# Patient Record
Sex: Female | Born: 2000 | Race: White | Hispanic: Yes | Marital: Single | Smoking: Never smoker
Health system: Southern US, Community
[De-identification: ages and names within clinical notes are randomized; demographics above are authoritative.]

## PROBLEM LIST (undated history)

## (undated) ENCOUNTER — Emergency Department (HOSPITAL_COMMUNITY): Discharge status: Absent without leave | Payer: Self-pay

---
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
## 2018-01-07 ENCOUNTER — Encounter: Payer: Self-pay | Admitting: Internal Medicine

## 2018-01-08 MED ORDER — NORETHIN ACE-ETH ESTRAD-FE 1.5-30 MG-MCG PO TABS: 1.0000 | ORAL_TABLET | Freq: Every day | ORAL | 0 refills | Status: DC

## 2018-03-29 ENCOUNTER — Encounter: Payer: Self-pay | Admitting: Internal Medicine

## 2018-03-29 MED ORDER — NORETHIN ACE-ETH ESTRAD-FE 1.5-30 MG-MCG PO TABS: 1.0000 | ORAL_TABLET | Freq: Every day | ORAL | 1 refills | Status: DC

## 2018-05-12 ENCOUNTER — Encounter: Payer: Self-pay | Admitting: Internal Medicine

## 2018-05-13 MED ORDER — NORETHIN ACE-ETH ESTRAD-FE 1.5-30 MG-MCG PO TABS: 1.0000 | ORAL_TABLET | Freq: Every day | ORAL | 0 refills | Status: DC

## 2018-06-23 ENCOUNTER — Encounter: Payer: Self-pay | Admitting: Internal Medicine

## 2018-06-23 MED ORDER — NORETHIN ACE-ETH ESTRAD-FE 1.5-30 MG-MCG PO TABS: 1.0000 | ORAL_TABLET | Freq: Every day | ORAL | 0 refills | Status: DC

## 2018-09-18 ENCOUNTER — Encounter: Payer: Self-pay | Admitting: Internal Medicine

## 2018-09-20 MED ORDER — NORETHIN ACE-ETH ESTRAD-FE 1.5-30 MG-MCG PO TABS: 1.0000 | ORAL_TABLET | Freq: Every day | ORAL | 0 refills | Status: DC

## 2018-10-18 ENCOUNTER — Ambulatory Visit (INDEPENDENT_AMBULATORY_CARE_PROVIDER_SITE_OTHER): Payer: BLUE CROSS/BLUE SHIELD | Admitting: Internal Medicine

## 2018-10-18 ENCOUNTER — Encounter: Payer: Self-pay | Admitting: Internal Medicine

## 2018-10-18 VITALS — BP 116/78 | HR 76 | Temp 98.2°F | Ht 65.5 in | Wt 179.0 lb

## 2018-10-18 DIAGNOSIS — Z0000 Encounter for general adult medical examination without abnormal findings: Secondary | ICD-10-CM

## 2018-10-18 LAB — LIPID PANEL
CHOL/HDL RATIO: 2
Cholesterol: 142 mg/dL (ref 0–200)
HDL: 69.3 mg/dL (ref >39.00)
LDL CALC: 49 mg/dL (ref 0–99)
NonHDL: 72.36
TRIGLYCERIDES: 119 mg/dL (ref 0.0–149.0)
VLDL: 23.8 mg/dL (ref 0.0–40.0)

## 2018-10-18 LAB — COMPREHENSIVE METABOLIC PANEL
ALT: 22 U/L (ref 0–35)
AST: 23 U/L (ref 0–37)
Albumin: 4.4 g/dL (ref 3.5–5.2)
Alkaline Phosphatase: 79 U/L (ref 47–119)
BUN: 11 mg/dL (ref 6–23)
CALCIUM: 9.6 mg/dL (ref 8.4–10.5)
CHLORIDE: 101 meq/L (ref 96–112)
CO2: 28 meq/L (ref 19–32)
Creatinine, Ser: 0.69 mg/dL (ref 0.40–1.20)
GFR: 116.17 mL/min (ref >60.00)
Glucose, Bld: 74 mg/dL (ref 70–99)
POTASSIUM: 3.8 meq/L (ref 3.5–5.1)
Sodium: 136 mEq/L (ref 135–145)
Total Bilirubin: 0.4 mg/dL (ref 0.2–1.2)
Total Protein: 7.6 g/dL (ref 6.0–8.3)

## 2018-10-18 LAB — CBC
HEMATOCRIT: 41 % (ref 36.0–49.0)
HEMOGLOBIN: 13.8 g/dL (ref 12.0–16.0)
MCHC: 33.7 g/dL (ref 31.0–37.0)
MCV: 87.3 fl (ref 78.0–98.0)
PLATELETS: 282 10*3/uL (ref 150.0–575.0)
RBC: 4.7 Mil/uL (ref 3.80–5.70)
RDW: 13.2 % (ref 11.4–15.5)
WBC: 8.5 10*3/uL (ref 4.5–13.5)

## 2018-10-18 NOTE — Progress Notes (Signed)
Subjective:    Patient ID: Madeline Vazquez, female    DOB: 2000/11/19, 17 y.o.   MRN: [id number]  HPI  Pt presents to the clinic today for her annual exam.  Flu: 05/2016 Tetanus: 03/2010 Dentist: [email or web address]  Diet: She does eat meat. She consumes some fruits and veggies. She does eat fried foods. She drinks mostly water. Exercise: 30 minutes, 3 x week, home videos   Review of Systems      No past medical history on file.  Current Outpatient Medications  Medication Sig Dispense Refill  . norethindrone-ethinyl estradiol-iron (BLISOVI FE 1.5/30) 1.5-30 MG-MCG tablet Take 1 tablet by mouth daily. 3 Package 0   No current facility-administered medications for this visit.     No Known Allergies  Family History  Problem Relation Age of Onset  . Alcohol abuse Father   . Arthritis Maternal Grandmother     Social History   Socioeconomic History  . Marital status: Single    Spouse name: Not on file  . Number of children: Not on file  . Years of education: Not on file  . Highest education level: Not on file  Occupational History  . Not on file  Social Needs  . Financial resource strain: Not on file  . Food insecurity:    Worry: Not on file    Inability: Not on file  . Transportation needs:    Medical: Not on file    Non-medical: Not on file  Tobacco Use  . Smoking status: Never Smoker  . Smokeless tobacco: Never Used  Substance and Sexual Activity  . Alcohol use: No    Alcohol/week: 0.0 standard drinks  . Drug use: No  . Sexual activity: Never  Lifestyle  . Physical activity:    Days per week: Not on file    Minutes per session: Not on file  . Stress: Not on file  Relationships  . Social connections:    Talks on phone: Not on file    Gets together: Not on file    Attends religious service: Not on file    Active member of club or organization: Not on file    Attends meetings of clubs or organizations: Not on file    Relationship status: Not on file  .  Intimate partner violence:    Fear of current or ex partner: Not on file    Emotionally abused: Not on file    Physically abused: Not on file    Forced sexual activity: Not on file  Other Topics Concern  . Not on file  Social History Narrative  . Not on file     Constitutional: Denies fever, malaise, fatigue, headache or abrupt weight changes.  HEENT: Denies eye pain, eye redness, ear pain, ringing in the ears, wax buildup, runny nose, nasal congestion, bloody nose, or sore throat. Respiratory: Denies difficulty breathing, shortness of breath, cough or sputum production.   Cardiovascular: Denies chest pain, chest tightness, palpitations or swelling in the hands or feet.  Gastrointestinal: Denies abdominal pain, bloating, constipation, diarrhea or blood in the stool.  GU: Denies urgency, frequency, pain with urination, burning sensation, blood in urine, odor or discharge. Musculoskeletal: Denies decrease in range of motion, difficulty with gait, muscle pain or joint pain and swelling.  Skin: Denies redness, rashes, lesions or ulcercations.  Neurological: Denies dizziness, difficulty with memory, difficulty with speech or problems with balance and coordination.  Psych: Denies anxiety, depression, SI/HI.  No other specific complaints in a complete review  of systems (except as listed in HPI above).  Objective:   Physical Exam   BP 116/78   Pulse 76   Temp 98.2 F (36.8 C) (Oral)   Ht 5' 5.5" (1.664 m)   Wt 179 lb (81.2 kg)   LMP 10/10/2018   SpO2 98%   BMI 29.33 kg/m  Wt Readings from Last 3 Encounters:  10/18/18 179 lb (81.2 kg) (95 %, Z= 1.60)*  10/13/17 160 lb (72.6 kg) (90 %, Z= 1.25)*  04/14/17 178 lb (80.7 kg) (95 %, Z= 1.66)*   * Growth percentiles are based on CDC (Girls, 2-20 Years) data.    General: Appears her stated age, well developed, well nourished in NAD. Skin: Warm, dry and intact.  HEENT: Head: normal shape and size; Eyes: sclera white, no icterus,  conjunctiva pink, PERRLA and EOMs intact; Ears: Tm's gray and intact, normal light reflex; Throat/Mouth: Teeth present, mucosa pink and moist, no exudate, lesions or ulcerations noted.  Neck:  Neck supple, trachea midline. No masses, lumps or thyromegaly present.  Cardiovascular: Normal rate and rhythm. S1,S2 noted.  No murmur, rubs or gallops noted. No JVD or BLE edema. Pulmonary/Chest: Normal effort and positive vesicular breath sounds. No respiratory distress. No wheezes, rales or ronchi noted.  Abdomen: Soft and nontender. Normal bowel sounds. No distention or masses noted. Liver, spleen and kidneys non palpable. Musculoskeletal: Strength 5/5 BUE/BLE. No difficulty with gait.  Neurological: Alert and oriented. Cranial nerves II-XII grossly intact. Coordination normal.  Psychiatric: Mood and affect normal. Behavior is normal. Judgment and thought content normal.     BMET No results found for: NA, K, CL, CO2, GLUCOSE, BUN, CREATININE, CALCIUM, GFRNONAA, GFRAA  Lipid Panel     Component Value Date/Time   CHOL 168 06/26/2016 1525   TRIG 79.0 06/26/2016 1525   HDL 67.00 06/26/2016 1525   CHOLHDL 3 06/26/2016 1525   VLDL 15.8 06/26/2016 1525   LDLCALC 86 06/26/2016 1525    CBC    Component Value Date/Time   WBC 8.1 06/26/2016 1525   RBC 4.66 06/26/2016 1525   HGB 13.5 06/26/2016 1525   HCT 40.8 06/26/2016 1525   PLT 285.0 06/26/2016 1525   MCV 87.7 06/26/2016 1525   MCHC 33.1 06/26/2016 1525   RDW 13.8 06/26/2016 1525    Hgb A1C No results found for: HGBA1C         Assessment & Plan:   Preventative Health Maintenance:  She declines flu shot Tetanus UTD Encouraged her to consume a balanced diet and exercise regimen Advised her to see a dentist annually Will check CBC, CMET, Lipid  Profile. She declines STD screening today  Return precautions discussed Nicki Reaperegina , NP'

## 2018-10-18 NOTE — Patient Instructions (Signed)

## 2018-12-10 ENCOUNTER — Encounter: Payer: Self-pay | Admitting: Internal Medicine

## 2018-12-10 MED ORDER — NORETHIN ACE-ETH ESTRAD-FE 1.5-30 MG-MCG PO TABS: 1.0000 | ORAL_TABLET | Freq: Every day | ORAL | 0 refills | Status: DC

## 2019-05-11 ENCOUNTER — Other Ambulatory Visit: Payer: Self-pay | Admitting: Internal Medicine

## 2019-07-21 ENCOUNTER — Encounter: Payer: Self-pay | Admitting: Internal Medicine

## 2020-06-05 ENCOUNTER — Telehealth (INDEPENDENT_AMBULATORY_CARE_PROVIDER_SITE_OTHER): Payer: Self-pay | Admitting: Internal Medicine

## 2020-06-05 ENCOUNTER — Other Ambulatory Visit: Payer: Self-pay

## 2020-06-05 ENCOUNTER — Encounter: Payer: Self-pay | Admitting: Internal Medicine

## 2020-06-05 DIAGNOSIS — J069 Acute upper respiratory infection, unspecified: Secondary | ICD-10-CM

## 2020-06-05 MED ORDER — AZITHROMYCIN 250 MG PO TABS: ORAL_TABLET | ORAL | 0 refills | Status: DC

## 2020-06-05 NOTE — Patient Instructions (Signed)

## 2020-06-05 NOTE — Progress Notes (Signed)
Virtual Visit via Video Note  I connected with Madeline Vazquez on 06/05/20 at 11:30 AM EDT by a video enabled telemedicine application and verified that I am speaking with the correct person using two identifiers.  Location: Patient: Home Provider: Office   I discussed the limitations of evaluation and management by telemedicine and the availability of in person appointments. The patient expressed understanding and agreed to proceed.  HPI  Patient reports nasal congestion, ear fullness, cough and chest congestion. She reports this started 2 weeks ago. She is blowing yellow/green mucus out of her nose. She denies ear pain, drainage or loss of hearing. The cough feels wet but is mostly nonproductive. She denies headache, facial pain or pressure, eye pain, eye redness or discharge, sore throat, loss of taste/smell or shortness of breath. She denies fever, chills or body aches. She has taken some medication OTC with minimal relief of symptoms. She has not had sick contacts or exposure to Covid that she is aware of. She has not had her Covid vaccine.  Review of Systems    No past medical history on file.  Family History  Problem Relation Age of Onset  . Alcohol abuse Father   . Arthritis Maternal Grandmother     Social History   Socioeconomic History  . Marital status: Single    Spouse name: Not on file  . Number of children: Not on file  . Years of education: Not on file  . Highest education level: Not on file  Occupational History  . Not on file  Tobacco Use  . Smoking status: Never Smoker  . Smokeless tobacco: Never Used  Substance and Sexual Activity  . Alcohol use: No    Alcohol/week: 0.0 standard drinks  . Drug use: No  . Sexual activity: Never  Other Topics Concern  . Not on file  Social History Narrative  . Not on file   Social Determinants of Health   Financial Resource Strain:   . Difficulty of Paying Living Expenses:   Food Insecurity:   . Worried About  Programme researcher, broadcasting/film/video in the Last Year:   . Barista in the Last Year:   Transportation Needs:   . Freight forwarder (Medical):   Marland Kitchen Lack of Transportation (Non-Medical):   Physical Activity:   . Days of Exercise per Week:   . Minutes of Exercise per Session:   Stress:   . Feeling of Stress :   Social Connections:   . Frequency of Communication with Friends and Family:   . Frequency of Social Gatherings with Friends and Family:   . Attends Religious Services:   . Active Member of Clubs or Organizations:   . Attends Banker Meetings:   Marland Kitchen Marital Status:   Intimate Partner Violence:   . Fear of Current or Ex-Partner:   . Emotionally Abused:   Marland Kitchen Physically Abused:   . Sexually Abused:     No Known Allergies   Constitutional: Denies headache, fatigue, fever or abrupt weight changes.  HEENT:  Positive ear fullness, nasal congestion. Denies eye redness, ear pain, ringing in the ears, wax buildup, runny nose or sore throat. Respiratory: Positive cough. Denies difficulty breathing or shortness of breath.  Cardiovascular: Denies chest pain, chest tightness, palpitations or swelling in the hands or feet.   No other specific complaints in a complete review of systems (except as listed in HPI above).  Objective:    General: Appears her stated age, in NAD.  HEENT: Head: normal shape and size; Nose: Sounds congested; Throat/Mouth: No hoarseness noted.   Pulmonary/Chest: Normal effort. No respiratory distress.       Assessment & Plan:   Upper Respiratory Infection:  Start Zyrtec and Flonase OTC eRx for Zithromax x5 days Delsym OTC as needed for cough  RTC as needed or if symptoms persist. Nicki Reaper, NP    Follow Up Instructions:    I discussed the assessment and treatment plan with the patient. The patient was provided an opportunity to ask questions and all were answered. The patient agreed with the plan and demonstrated an understanding of the  instructions.   The patient was advised to call back or seek an in-person evaluation if the symptoms worsen or if the condition fails to improve as anticipated.    Nicki Reaper, NP

## 2021-10-02 ENCOUNTER — Telehealth: Payer: Self-pay | Admitting: Emergency Medicine

## 2021-10-02 DIAGNOSIS — J069 Acute upper respiratory infection, unspecified: Secondary | ICD-10-CM

## 2021-10-02 DIAGNOSIS — J029 Acute pharyngitis, unspecified: Secondary | ICD-10-CM

## 2021-10-02 NOTE — Progress Notes (Signed)
  E-Visit for Sore Throat  We are sorry that you are not feeling well.  Here is how we plan to help!  Your symptoms indicate a likely viral infection (Pharyngitis).   Pharyngitis is inflammation in the back of the throat which can cause a sore throat, scratchiness and sometimes difficulty swallowing.   Pharyngitis is typically caused by a respiratory virus and will just run its course.  Please keep in mind that your symptoms could last up to 10 days.  For throat pain, we recommend over the counter oral pain relief medications such as acetaminophen or aspirin, or anti-inflammatory medications such as ibuprofen or naproxen sodium.  Topical treatments such as oral throat lozenges or sprays may be used as needed.  Avoid close contact with loved ones, especially the very young and elderly.  Remember to wash your hands thoroughly throughout the day as this is the number one way to prevent the spread of infection and wipe down door knobs and counters with disinfectant.  After careful review of your answers, I would not recommend an antibiotic for your condition.  Antibiotics should not be used to treat conditions that we suspect are caused by viruses like the virus that causes the common cold or flu. However, some people can have Strep with atypical symptoms. You may need formal testing in clinic or office to confirm if your symptoms continue or worsen.  Providers prescribe antibiotics to treat infections caused by bacteria. Antibiotics are very powerful in treating bacterial infections when they are used properly.  To maintain their effectiveness, they should be used only when necessary.  Overuse of antibiotics has resulted in the development of super bugs that are resistant to treatment!    Home Care: Only take medications as instructed by your medical team. Do not drink alcohol while taking these medications. A steam or ultrasonic humidifier can help congestion.  You can place a towel over your head and  breathe in the steam from hot water coming from a faucet. Avoid close contacts especially the very young and the elderly. Cover your mouth when you cough or sneeze. Always remember to wash your hands.  Get Help Right Away If: You develop worsening fever or throat pain. You develop a severe head ache or visual changes. Your symptoms persist after you have completed your treatment plan.  Make sure you Understand these instructions. Will watch your condition. Will get help right away if you are not doing well or get worse.   Thank you for choosing an e-visit.  Your e-visit answers were reviewed by a board certified advanced clinical practitioner to complete your personal care plan. Depending upon the condition, your plan could have included both over the counter or prescription medications.  Please review your pharmacy choice. Make sure the pharmacy is open so you can pick up prescription now. If there is a problem, you may contact your provider through MyChart messaging and have the prescription routed to another pharmacy.  Your safety is important to us. If you have drug allergies check your prescription carefully.   For the next 24 hours you can use MyChart to ask questions about today's visit, request a non-urgent call back, or ask for a work or school excuse. You will get an email in the next two days asking about your experience. I hope that your e-visit has been valuable and will speed your recovery.  Approximately 5 minutes was used in reviewing the patient's chart, questionnaire, prescribing medications, and documentation.  

## 2022-09-09 ENCOUNTER — Telehealth: Payer: Self-pay | Admitting: Nurse Practitioner

## 2022-09-09 DIAGNOSIS — J069 Acute upper respiratory infection, unspecified: Secondary | ICD-10-CM

## 2022-09-09 MED ORDER — FLUTICASONE PROPIONATE 50 MCG/ACT NA SUSP: 2.0000 | Freq: Every day | NASAL | 6 refills | Status: AC

## 2022-09-09 NOTE — Progress Notes (Signed)
E-Visit for Sinus Problems  We are sorry that you are not feeling well.  Here is how we plan to help!  Based on what you have shared with me it looks like you have sinusitis.  Sinusitis is inflammation and infection in the sinus cavities of the head.  Based on your presentation I believe you most likely have Acute Viral Sinusitis.This is an infection most likely caused by a virus. There is not specific treatment for viral sinusitis other than to help you with the symptoms until the infection runs its course.  You may use an oral decongestant such as Mucinex D or if you have glaucoma or high blood pressure use plain Mucinex. Saline nasal spray help and can safely be used as often as needed for congestion, I have prescribed: Fluticasone nasal spray two sprays in each nostril once a day.  Providers prescribe antibiotics to treat infections caused by bacteria. Antibiotics are very powerful in treating bacterial infections when they are used properly. To maintain their effectiveness, they should be used only when necessary. Overuse of antibiotics has resulted in the development of superbugs that are resistant to treatment!    After careful review of your answers, I would not recommend an antibiotic for your condition.  Antibiotics are not effective against viruses and therefore should not be used to treat them. Common examples of infections caused by viruses include colds and flu   Some authorities believe that zinc sprays or the use of Echinacea may shorten the course of your symptoms.  Sinus infections are not as easily transmitted as other respiratory infection, however we still recommend that you avoid close contact with loved ones, especially the very young and elderly.  Remember to wash your hands thoroughly throughout the day as this is the number one way to prevent the spread of infection!  Home Care: Only take medications as instructed by your medical team. Do not take these medications with  alcohol. A steam or ultrasonic humidifier can help congestion.  You can place a towel over your head and breathe in the steam from hot water coming from a faucet. Avoid close contacts especially the very young and the elderly. Cover your mouth when you cough or sneeze. Always remember to wash your hands.  Get Help Right Away If: You develop worsening fever or sinus pain. You develop a severe head ache or visual changes. Your symptoms persist after you have completed your treatment plan.  Make sure you Understand these instructions. Will watch your condition. Will get help right away if you are not doing well or get worse.   Thank you for choosing an e-visit.  Your e-visit answers were reviewed by a board certified advanced clinical practitioner to complete your personal care plan. Depending upon the condition, your plan could have included both over the counter or prescription medications.  Please review your pharmacy choice. Make sure the pharmacy is open so you can pick up prescription now. If there is a problem, you may contact your provider through MyChart messaging and have the prescription routed to another pharmacy.  Your safety is important to us. If you have drug allergies check your prescription carefully.   For the next 24 hours you can use MyChart to ask questions about today's visit, request a non-urgent call back, or ask for a work or school excuse. You will get an email in the next two days asking about your experience. I hope that your e-visit has been valuable and will speed your recovery.    Meds ordered this encounter  Medications   fluticasone (FLONASE) 50 MCG/ACT nasal spray    Sig: Place 2 sprays into both nostrils daily.    Dispense:  16 g    Refill:  6    I spent approximately 5 minutes reviewing the patient's history, current symptoms and coordinating their plan of care today.
